# Patient Record
Sex: Female | Born: 1980 | Race: White | Hispanic: No | Marital: Married | Smoking: Never smoker
Health system: Southern US, Community
[De-identification: ages and names within clinical notes are randomized; demographics above are authoritative.]

## PROBLEM LIST (undated history)

## (undated) DIAGNOSIS — F988 Other specified behavioral and emotional disorders with onset usually occurring in childhood and adolescence: Secondary | ICD-10-CM

## (undated) HISTORY — PX: ABDOMINAL HYSTERECTOMY: SHX81

---

## 2012-10-25 ENCOUNTER — Ambulatory Visit: Payer: Self-pay | Admitting: Internal Medicine

## 2014-04-27 ENCOUNTER — Ambulatory Visit: Payer: Self-pay | Admitting: Physician Assistant

## 2014-04-27 LAB — URINALYSIS, COMPLETE
BILIRUBIN, UR: NEGATIVE
BLOOD: NEGATIVE
GLUCOSE, UR: NEGATIVE mg/dL (ref 0–75)
Ketone: NEGATIVE
NITRITE: NEGATIVE
Ph: 6 (ref 4.5–8.0)
Protein: NEGATIVE
SPECIFIC GRAVITY: 1.015 (ref 1.003–1.030)
WBC UR: >30 /HPF (ref 0–5)

## 2014-04-29 LAB — URINE CULTURE

## 2014-05-30 ENCOUNTER — Ambulatory Visit: Payer: Self-pay | Admitting: Physician Assistant

## 2014-05-30 LAB — URINALYSIS, COMPLETE
Bilirubin,UR: NEGATIVE
GLUCOSE, UR: NEGATIVE mg/dL (ref 0–75)
KETONE: NEGATIVE
Nitrite: POSITIVE
Ph: 7 (ref 4.5–8.0)
Specific Gravity: 1.01 (ref 1.003–1.030)

## 2014-06-02 LAB — URINE CULTURE

## 2014-08-30 ENCOUNTER — Ambulatory Visit: Payer: Self-pay | Admitting: Physician Assistant

## 2014-08-30 LAB — URINALYSIS, COMPLETE
Bilirubin,UR: NEGATIVE
GLUCOSE, UR: NEGATIVE
Ketone: NEGATIVE
Nitrite: NEGATIVE
PH: 8 (ref 5.0–8.0)
Protein: NEGATIVE
Specific Gravity: 1.02 (ref 1.000–1.030)

## 2014-08-30 LAB — WET PREP, GENITAL

## 2014-08-30 LAB — GC/CHLAMYDIA PROBE AMP

## 2014-09-01 LAB — URINE CULTURE

## 2014-09-03 ENCOUNTER — Ambulatory Visit: Payer: Self-pay | Admitting: Physician Assistant

## 2015-01-20 ENCOUNTER — Ambulatory Visit: Payer: Self-pay | Admitting: Emergency Medicine

## 2016-05-30 IMAGING — CR DG KNEE COMPLETE 4+V*L*
4 series · 4 of 4 positions shown · non-contrast
Comparison: None.

CLINICAL DATA: Pt walked into a stack of crates, hitting left knee
on the corner of one of them at work earlier this evening. Pain all
over knee especially the patella.

EXAM:
LEFT KNEE - COMPLETE 4+ VIEW

[knee ap]
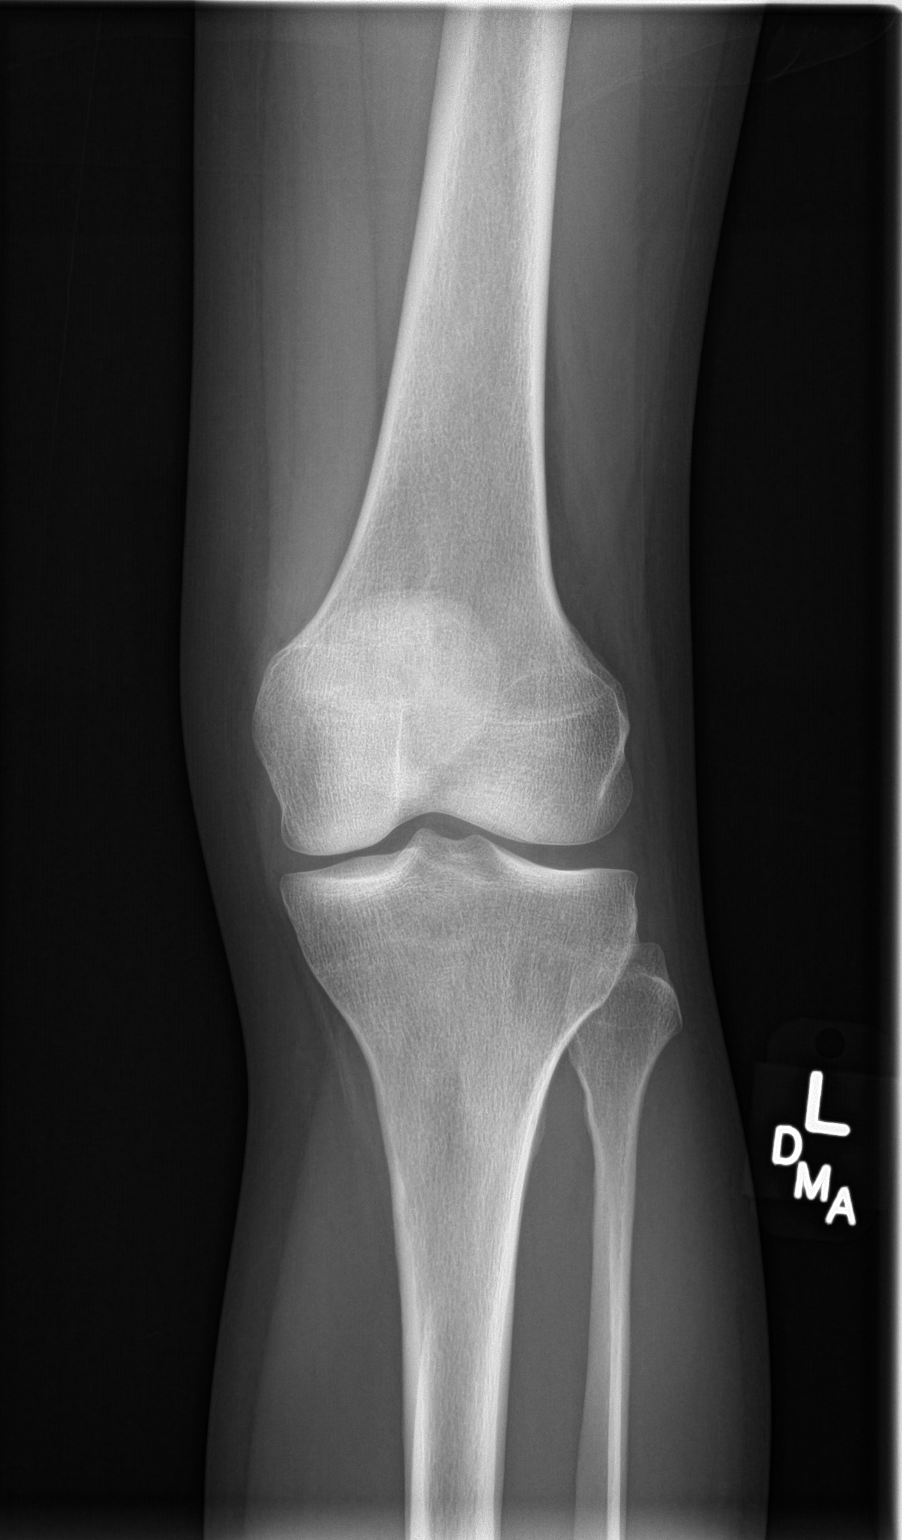

[knee obl (1 of 2)]
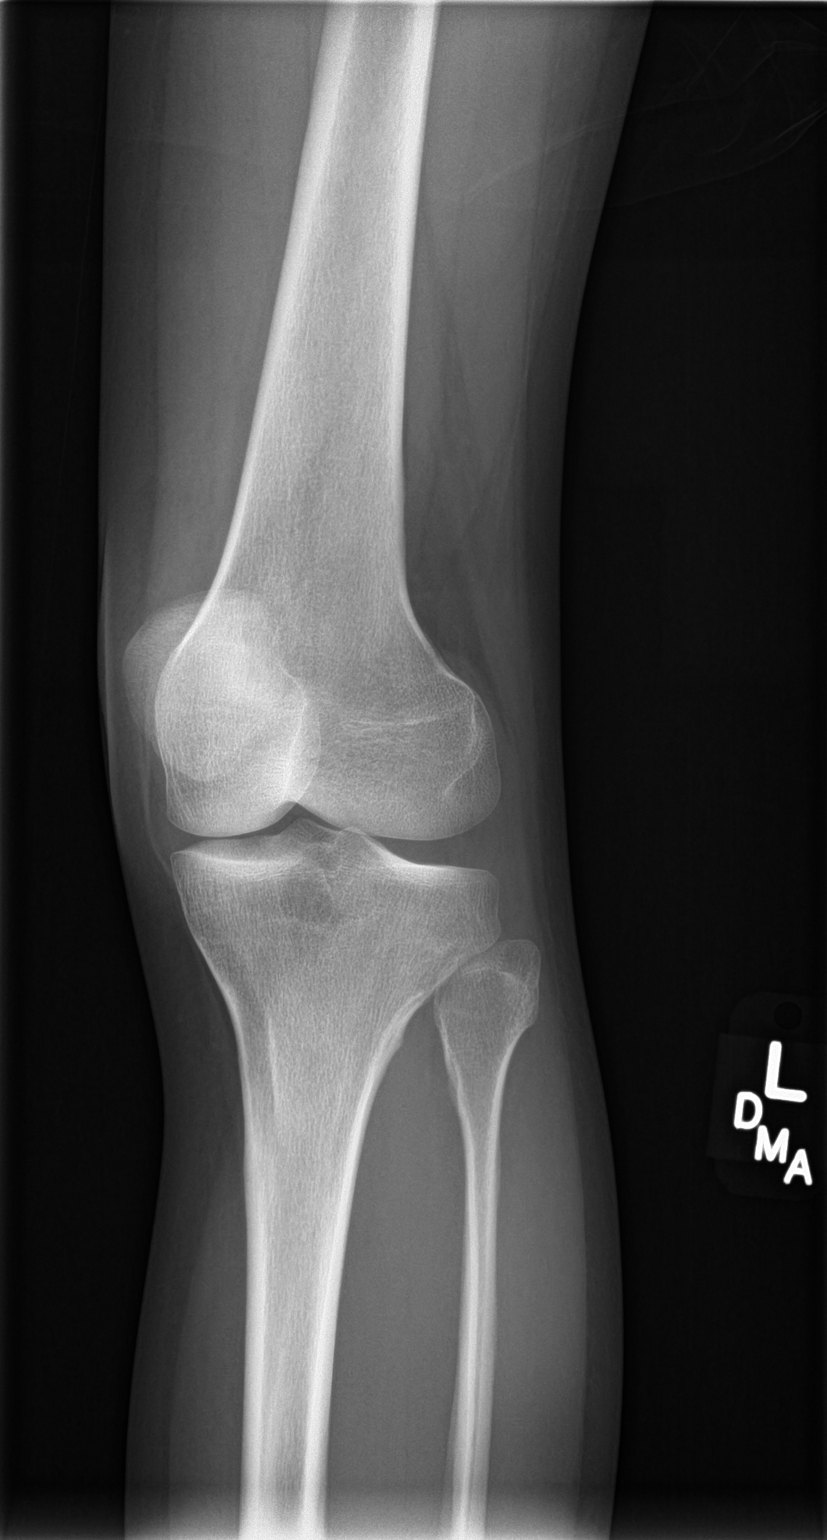

[knee obl (2 of 2)]
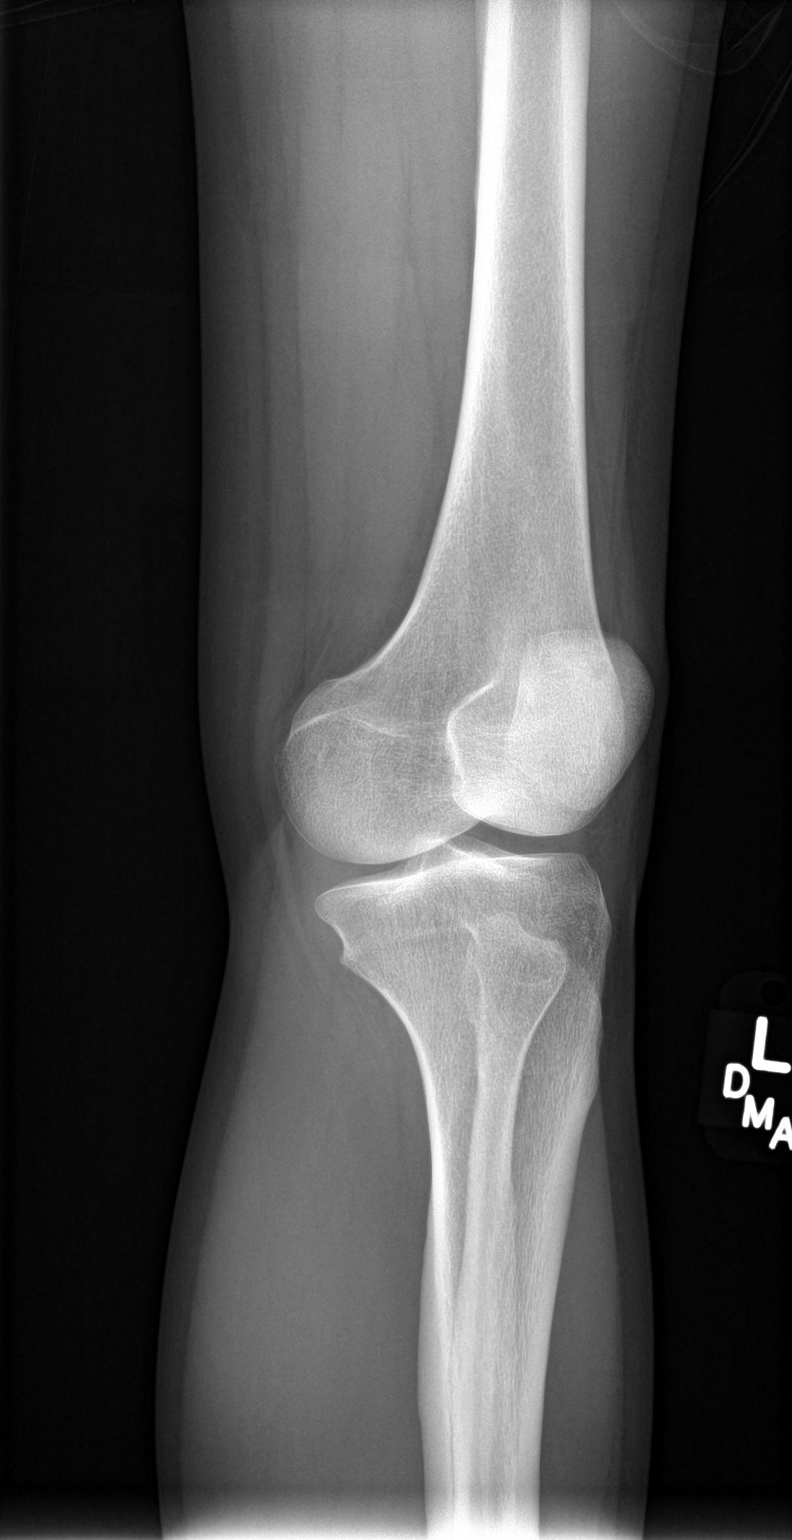

[knee lat]
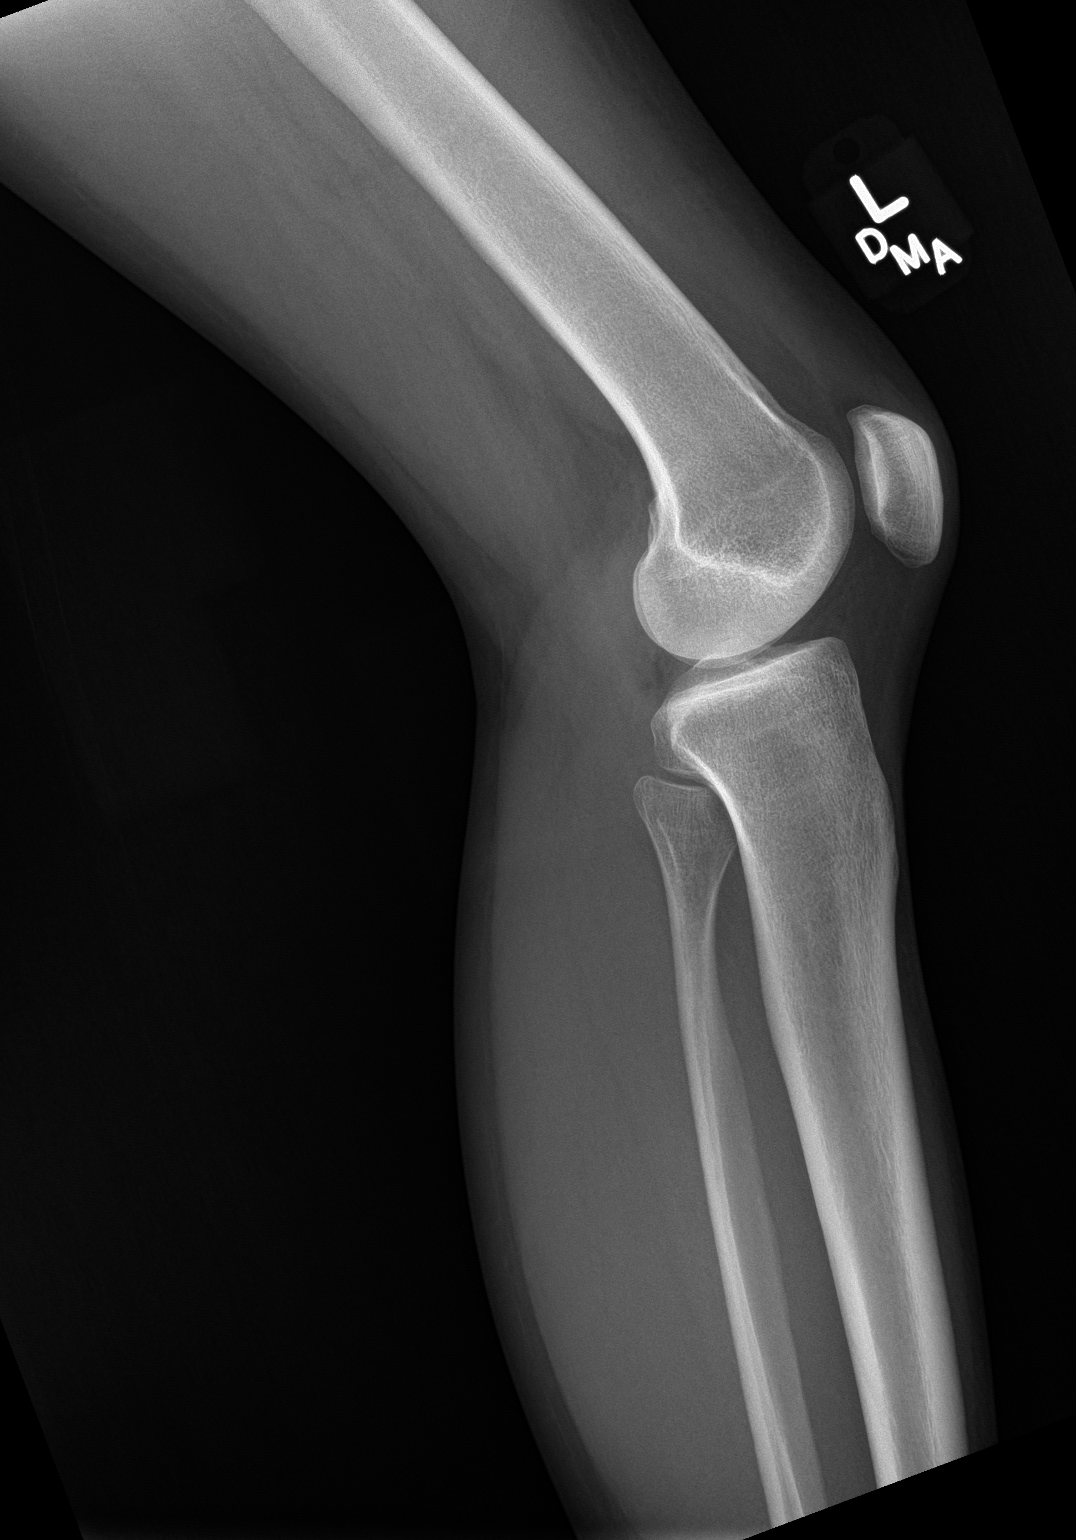

[4 of 4 positions shown; findings below may reference images not displayed]

FINDINGS: There is no evidence of fracture, dislocation, or joint effusion.
There is no evidence of arthropathy or other focal bone abnormality.
Soft tissues are unremarkable.
IMPRESSION: Negative.

## 2019-10-30 ENCOUNTER — Ambulatory Visit
Admission: EM | Admit: 2019-10-30 | Discharge: 2019-10-30 | Disposition: A | Discharge status: Home / Self Care | Payer: 59 | Attending: Family Medicine | Admitting: Family Medicine

## 2019-10-30 ENCOUNTER — Other Ambulatory Visit: Payer: Self-pay

## 2019-10-30 ENCOUNTER — Encounter: Payer: Self-pay | Admitting: Emergency Medicine

## 2019-10-30 DIAGNOSIS — Z20828 Contact with and (suspected) exposure to other viral communicable diseases: Secondary | ICD-10-CM | POA: Diagnosis not present

## 2019-10-30 DIAGNOSIS — J069 Acute upper respiratory infection, unspecified: Secondary | ICD-10-CM

## 2019-10-30 DIAGNOSIS — Z20822 Contact with and (suspected) exposure to covid-19: Secondary | ICD-10-CM

## 2019-10-30 HISTORY — DX: Other specified behavioral and emotional disorders with onset usually occurring in childhood and adolescence: F98.8

## 2019-10-30 NOTE — ED Provider Notes (Signed)
[facility]    CSN: [id number] Arrival date & time: 10/30/19  0818      History   Chief Complaint Chief Complaint  Patient presents with  . Headache  . Sore Throat  . Back Pain    HPI Andrea Mitchell is a 38 y.o. female.   39 yo female with a c/o fatigue, low grade fevers, headaches for several days. States she had  covid exposure at work. Denies any cough, shortness of breath.   Headache Associated symptoms: back pain   Sore Throat Associated symptoms include headaches.  Back Pain Associated symptoms: headaches     Past Medical History:  Diagnosis Date  . ADD (attention deficit disorder)     There are no active problems to display for this patient.   Past Surgical History:  Procedure Laterality Date  . ABDOMINAL HYSTERECTOMY      OB History   No obstetric history on file.      Home Medications    Prior to Admission medications   Medication Sig Start Date End Date Taking? Authorizing Provider  ADDERALL XR 30 MG 24 hr capsule Take 30 mg by mouth daily. 09/16/19  Yes [provider]  clonazePAM (KLONOPIN) 1 MG tablet  12/14/14  Yes [provider]  VYVANSE 40 MG capsule Take 40 mg by mouth every morning. 09/16/19  Yes [provider]    Family History Family History  Adopted: Yes  Family history unknown: Yes    Social History Social History   Tobacco Use  . Smoking status: Never Smoker  . Smokeless tobacco: Never Used  Substance Use Topics  . Alcohol use: Yes    Alcohol/week: 7.0 standard drinks    Types: 7 Cans of beer per week  . Drug use: Never     Allergies   Levofloxacin, Silver sulfadiazine, Oxycodone-acetaminophen, and Tramadol   Review of Systems Review of Systems  Musculoskeletal: Positive for back pain.  Neurological: Positive for headaches.     Physical Exam Triage Vital Signs ED Triage Vitals [10/30/19 0835]  Enc Vitals Group     BP 130/87     Pulse Rate 77     Resp  18     Temp 98.4 F (36.9 C)     Temp Source Oral     SpO2 99 %     Weight 112 lb (50.8 kg)     Height 5\' 5"  (1.651 m)     Head Circumference      Peak Flow      Pain Score 6     Pain Loc      Pain Edu?      Excl. in GC?    No data found.  Updated Vital Signs BP 130/87 (BP Location: Left Arm)   Pulse 77   Temp 98.4 F (36.9 C) (Oral)   Resp 18   Ht 5\' 5"  (1.651 m)   Wt 50.8 kg   SpO2 99%   BMI 18.64 kg/m   Visual Acuity Right Eye Distance:   Left Eye Distance:   Bilateral Distance:    Right Eye Near:   Left Eye Near:    Bilateral Near:     Physical Exam Vitals signs and nursing note reviewed.  Constitutional:      General: She is not in acute distress.    Appearance: She is not toxic-appearing or diaphoretic.  Cardiovascular:     Rate and Rhythm: Normal rate.  Pulmonary:     Effort: Pulmonary  effort is normal. No respiratory distress.  Neurological:     Mental Status: She is alert.      UC Treatments / Results  Labs (all labs ordered are listed, but only abnormal results are displayed) Labs Reviewed  NOVEL CORONAVIRUS, NAA (HOSP ORDER, SEND-OUT TO REF LAB; TAT 18-24 HRS)    EKG   Radiology No results found.  Procedures Procedures (including critical care time)  Medications Ordered in UC Medications - No data to display  Initial Impression / Assessment and Plan / UC Course  I have reviewed the triage vital signs and the nursing notes.  Pertinent labs & imaging results that were available during my care of the patient were reviewed by me and considered in my medical decision making (see chart for details).      Final Clinical Impressions(s) / UC Diagnoses   Final diagnoses:  Close exposure to COVID-19 virus  Viral URI    ED Prescriptions    None      1. diagnosis reviewed with patient 2. Recommend supportive treatment with rest, fluids, tylenol/advil as needed 3. Await covid test result  4. Follow-up prn if symptoms worsen or  don't improve   PDMP not reviewed this encounter.   Norval Gable, MD 10/30/19 1016

## 2019-10-30 NOTE — ED Triage Notes (Signed)
Patient in today c/o headache, sore throat, back pain, fatigue and low grade fever (99-101.1) x 3-4 days. Patient has had a +Covid exposure at work.

## 2019-10-31 LAB — NOVEL CORONAVIRUS, NAA (HOSP ORDER, SEND-OUT TO REF LAB; TAT 18-24 HRS): SARS-CoV-2, NAA: NOT DETECTED
